# Patient Record
Sex: Female | Born: 2000 | Race: White | Hispanic: No | Marital: Single | Smoking: Never smoker
Health system: Southern US, Community
[De-identification: ages and names within clinical notes are randomized; demographics above are authoritative.]

## PROBLEM LIST (undated history)

## (undated) HISTORY — DX: Morbid (severe) obesity due to excess calories: E66.01

---
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
## 2019-02-05 ENCOUNTER — Other Ambulatory Visit: Payer: Self-pay | Admitting: Family Medicine

## 2019-02-05 NOTE — Telephone Encounter (Signed)
Please contact patient. Recommend physical this fall. Last one April 2018. Thanks.

## 2019-10-22 ENCOUNTER — Ambulatory Visit: Payer: Managed Care, Other (non HMO) | Attending: Internal Medicine

## 2019-10-22 ENCOUNTER — Ambulatory Visit (INDEPENDENT_AMBULATORY_CARE_PROVIDER_SITE_OTHER): Payer: Managed Care, Other (non HMO) | Admitting: Family Medicine

## 2019-10-22 ENCOUNTER — Other Ambulatory Visit: Payer: Self-pay

## 2019-10-22 DIAGNOSIS — Z20822 Contact with and (suspected) exposure to covid-19: Secondary | ICD-10-CM

## 2019-10-22 DIAGNOSIS — J310 Chronic rhinitis: Secondary | ICD-10-CM

## 2019-10-22 DIAGNOSIS — J329 Chronic sinusitis, unspecified: Secondary | ICD-10-CM | POA: Diagnosis not present

## 2019-10-22 MED ORDER — ONDANSETRON 4 MG PO TBDP: ORAL_TABLET | ORAL | 0 refills | Status: DC

## 2019-10-22 MED ORDER — AMOXICILLIN 500 MG PO CAPS: 500.0000 mg | ORAL_CAPSULE | Freq: Three times a day (TID) | ORAL | 0 refills | Status: DC

## 2019-10-22 NOTE — Progress Notes (Signed)
   Subjective:  Audio   Patient ID: Krista Guzman, female    DOB: [birth year], 19 y.o.   MRN: [id number]  Cough This is a new problem. Episode onset: 8 days. Associated symptoms comments: Congestion, headache.   Nausea, Dizziness and lightheaded for the past 12 hours. Has been in bed for the last few days.   Virtual Visit via Telephone Note  I connected with Krista Guzman on 10/22/19 at 10:00 AM EDT by telephone and verified that I am speaking with the correct person using two identifiers.  Location: Patient: home Provider: office   I discussed the limitations, risks, security and privacy concerns of performing an evaluation and management service by telephone and the availability of in person appointments. I also discussed with the patient that there may be a patient responsible charge related to this service. The patient expressed understanding and agreed to proceed.   History of Present Illness:    Observations/Objective:   Assessment and Plan:   Follow Up Instructions:    I discussed the assessment and treatment plan with the patient. The patient was provided an opportunity to ask questions and all were answered. The patient agreed with the plan and demonstrated an understanding of the instructions.   The patient was advised to call back or seek an in-person evaluation if the symptoms worsen or if the condition fails to improve as anticipated.  I provided 22 minutes of non-face-to-face time during this encounter.  h a fri night  Sat morn ticle throat and cough  mon sore throat  tue bad cough  No fever  No aches and chills  Not eating the best  Feels cong in nose n in chest   No diarrhea  Not much   Online   Church actitivities       Review of Systems  Respiratory: Positive for cough.        Objective:   Physical Exam   Virtual     Assessment & Plan:  Impression #1 concerning cascaded symptomatology.  May well be simple springtime virus  plus secondary sinusitis/bronchitis, however cannot rule out Covid with symptomatology.  Antibiotics prescribed.  Zofran as needed.  Warning signs discussed.  Oxygen monitoring encouraged if shortness of breath or positive Covid test.  Await results

## 2019-10-23 ENCOUNTER — Other Ambulatory Visit: Payer: Self-pay | Admitting: *Deleted

## 2019-10-23 ENCOUNTER — Telehealth: Payer: Self-pay | Admitting: Family Medicine

## 2019-10-23 ENCOUNTER — Encounter: Payer: Self-pay | Admitting: Family Medicine

## 2019-10-23 LAB — SARS-COV-2, NAA 2 DAY TAT

## 2019-10-23 LAB — NOVEL CORONAVIRUS, NAA: SARS-CoV-2, NAA: DETECTED — AB

## 2019-10-23 MED ORDER — AMOXICILLIN 400 MG/5ML PO SUSR: ORAL | 0 refills | Status: DC

## 2019-10-23 NOTE — Telephone Encounter (Signed)
Thx for update

## 2019-10-23 NOTE — Telephone Encounter (Signed)
Mom calling to make Dr. Brett Canales aware daughter has tested positive for covid.

## 2019-10-23 NOTE — Telephone Encounter (Signed)
Nurse plz call and tell mom thx for info, how is she doing any new concerns? Of course take all precautions to avoid sppread at home

## 2019-10-24 ENCOUNTER — Encounter: Payer: Self-pay | Admitting: Physician Assistant

## 2019-10-24 ENCOUNTER — Telehealth: Payer: Self-pay | Admitting: Physician Assistant

## 2019-10-24 NOTE — Telephone Encounter (Signed)
Called to discuss with patient about Covid symptoms and the use of bamlanivimab or casirivimab/imdevimab, a monoclonal antibody infusion for those with mild to moderate Covid symptoms and at a high risk of hospitalization.  Pt is qualified for this infusion at the [facility] infusion center due to [employer] to mother who is also sick and pending a covid test. They will plan to come in together for infusion on Monday. Will wait until mother's test results before placing orders.   [name] PA-C  [facility]

## 2019-10-25 ENCOUNTER — Other Ambulatory Visit: Payer: Self-pay | Admitting: Physician Assistant

## 2019-10-25 DIAGNOSIS — U071 COVID-19: Secondary | ICD-10-CM

## 2019-10-25 MED ORDER — SODIUM CHLORIDE 0.9 % IV SOLN
700.0000 mg | Freq: Once | INTRAVENOUS | Status: AC
  Administered 2019-10-26: 12:00:00 700 mg via INTRAVENOUS
  Filled 2019-10-25: qty 700

## 2019-10-25 NOTE — Progress Notes (Signed)
  I connected by phone with Krista Guzman on 10/25/2019 at 7:17 AM to discuss the potential use of an new treatment for mild to moderate COVID-19 viral infection in non-hospitalized patients.  This patient is a 19 y.o. female that meets the FDA criteria for Emergency Use Authorization of bamlanivimab or casirivimab\imdevimab.  Has a (+) direct SARS-CoV-2 viral test result  Has mild or moderate COVID-19   Is ? 20 years of age and weighs ? 40 kg  Is NOT hospitalized due to COVID-19  Is NOT requiring oxygen therapy or requiring an increase in baseline oxygen flow rate due to COVID-19  Is within 10 days of symptom onset  Has at least one of the high risk factor(s) for progression to severe COVID-19 and/or hospitalization as defined in EUA.  Specific high risk criteria : BMI >/= 35   I have spoken and communicated the following to the patient or parent/caregiver:  1. FDA has authorized the emergency use of bamlanivimab and casirivimab\imdevimab for the treatment of mild to moderate COVID-19 in adults and pediatric patients with positive results of direct SARS-CoV-2 viral testing who are 59 years of age and older weighing at least 40 kg, and who are at high risk for progressing to severe COVID-19 and/or hospitalization.  2. The significant known and potential risks and benefits of bamlanivimab and casirivimab\imdevimab, and the extent to which such potential risks and benefits are unknown.  3. Information on available alternative treatments and the risks and benefits of those alternatives, including clinical trials.  4. Patients treated with bamlanivimab and casirivimab\imdevimab should continue to self-isolate and use infection control measures (e.g., wear mask, isolate, social distance, avoid sharing personal items, clean and disinfect "high touch" surfaces, and frequent handwashing) according to CDC guidelines.   5. The patient or parent/caregiver has the option to accept or refuse  bamlanivimab or casirivimab\imdevimab .  After reviewing this information with the patient, The patient agreed to proceed with receiving the bamlanimivab infusion and will be provided a copy of the Fact sheet prior to receiving the infusion..  Sx onset 3/27. Infusion set up for 4/5 @ 11;30. Directions given  [name] 10/25/2019 7:17 AM

## 2019-10-26 ENCOUNTER — Ambulatory Visit (HOSPITAL_COMMUNITY)
Admission: RE | Admit: 2019-10-26 | Discharge: 2019-10-26 | Disposition: A | Discharge status: Home / Self Care | Payer: Managed Care, Other (non HMO) | Source: Ambulatory Visit | Attending: Pulmonary Disease | Admitting: Pulmonary Disease

## 2019-10-26 DIAGNOSIS — U071 COVID-19: Secondary | ICD-10-CM | POA: Diagnosis present

## 2019-10-26 MED ORDER — SODIUM CHLORIDE 0.9 % IV BOLUS
1000.0000 mL | Freq: Once | INTRAVENOUS | Status: AC
  Administered 2019-10-26: 12:00:00 1000 mL via INTRAVENOUS

## 2019-10-26 MED ORDER — FAMOTIDINE IN NACL 20-0.9 MG/50ML-% IV SOLN: 20.0000 mg | Freq: Once | INTRAVENOUS | Status: DC | PRN

## 2019-10-26 MED ORDER — METHYLPREDNISOLONE SODIUM SUCC 125 MG IJ SOLR: 125.0000 mg | Freq: Once | INTRAMUSCULAR | Status: DC | PRN

## 2019-10-26 MED ORDER — ALBUTEROL SULFATE HFA 108 (90 BASE) MCG/ACT IN AERS: 2.0000 | INHALATION_SPRAY | Freq: Once | RESPIRATORY_TRACT | Status: DC | PRN

## 2019-10-26 MED ORDER — DIPHENHYDRAMINE HCL 50 MG/ML IJ SOLN: 50.0000 mg | Freq: Once | INTRAMUSCULAR | Status: DC | PRN

## 2019-10-26 MED ORDER — SODIUM CHLORIDE 0.9 % IV SOLN
INTRAVENOUS | Status: DC | PRN
  Administered 2019-10-26: 12:00:00 250 mL via INTRAVENOUS

## 2019-10-26 MED ORDER — EPINEPHRINE 0.3 MG/0.3ML IJ SOAJ: 0.3000 mg | Freq: Once | INTRAMUSCULAR | Status: DC | PRN

## 2019-10-26 NOTE — Discharge Instructions (Signed)

## 2020-06-28 ENCOUNTER — Ambulatory Visit (INDEPENDENT_AMBULATORY_CARE_PROVIDER_SITE_OTHER): Payer: Managed Care, Other (non HMO) | Admitting: Adult Health

## 2020-06-28 ENCOUNTER — Other Ambulatory Visit: Payer: Self-pay

## 2020-06-28 ENCOUNTER — Encounter: Payer: Self-pay | Admitting: Adult Health

## 2020-06-28 VITALS — BP 124/80 | HR 76 | Ht 69.0 in | Wt 321.0 lb

## 2020-06-28 DIAGNOSIS — N911 Secondary amenorrhea: Secondary | ICD-10-CM | POA: Insufficient documentation

## 2020-06-28 LAB — POCT URINE PREGNANCY: Preg Test, Ur: NEGATIVE

## 2020-06-28 MED ORDER — LO LOESTRIN FE 1 MG-10 MCG / 10 MCG PO TABS: 1.0000 | ORAL_TABLET | Freq: Every day | ORAL | 11 refills | Status: DC

## 2020-06-28 MED ORDER — MEDROXYPROGESTERONE ACETATE 10 MG PO TABS: ORAL_TABLET | ORAL | 0 refills | Status: DC

## 2020-06-28 NOTE — Progress Notes (Signed)
  Subjective:     Patient ID: Krista Guzman, female   DOB: Jun 09, 2001, 19 y.o.   MRN: [id number]  HPI Marin is a 19 year old white female,single,G0P0, in complaining of no period since August and periods always irregular. She is a new pt, her mom is with her. She started at about age 40 and never regular, she tried OCs and they did not help. PCP is [employer].  Review of Systems irregular periods Has never had sex Denies any increased hair Weight is stable Reviewed past medical,surgical, social and family history. Reviewed medications and allergies.      Objective:   Physical Exam BP 124/80 (BP Location: Left Arm, Patient Position: Sitting, Cuff Size: Large)   Pulse 76   Ht 5\' 9"  (1.753 m)   Wt (!) 321 lb (145.6 kg)   LMP 03/14/2020 (Approximate)   BMI 47.40 kg/m   UPT is negative. Skin warm and dry. Neck: mid line trachea, normal thyroid, good ROM, no lymphadenopathy noted. Lungs: clear to ausculation bilaterally. Cardiovascular: regular rate and rhythm.   AA is 0  Fall risk is low PHQ 9 score is 0  Upstream - 06/28/20 1148      Pregnancy Intention Screening   Does the patient want to become pregnant in the next year? N/A    Does the patient's partner want to become pregnant in the next year? N/A    Would the patient like to discuss contraceptive options today? N/A      Contraception Wrap Up   Current Method Abstinence    End Method Oral Contraceptive    Contraception Counseling Provided No          Assessment:     1. Secondary amenorrhea Will try to get withdrawal bleed with provera then cycle with OCs Meds ordered this encounter  Medications  . medroxyPROGESTERone (PROVERA) 10 MG tablet    Sig: Take 1 daily for 10 days    Dispense:  10 tablet    Refill:  0    Order Specific Question:   Supervising Provider    Answer:   14/07/21, LUTHER H [[id number]]  . Norethindrone-Ethinyl Estradiol-Fe Biphas (LO LOESTRIN FE) 1 MG-10 MCG / 10 MCG tablet    Sig: Take 1 tablet by  mouth daily. Take 1 daily by mouth    Dispense:  28 tablet    Refill:  11    BIN Despina Hidden, PCN CN, GRP [id number]    Order Specific Question:   Supervising Provider    Answer:   [id number] [[id number]]  Check CBC,CMP,TSH and A1c  2. Morbid obesity (HCC)     Plan:     Follow up in 3 weeks  May get Duane Lope to assess ovaries and rule out PCO

## 2020-06-29 LAB — TSH: TSH: 1.32 u[IU]/mL (ref 0.450–4.500)

## 2020-06-29 LAB — COMPREHENSIVE METABOLIC PANEL
ALT: 26 IU/L (ref 0–32)
AST: 21 IU/L (ref 0–40)
Albumin/Globulin Ratio: 1.5 (ref 1.2–2.2)
Albumin: 4.5 g/dL (ref 3.9–5.0)
Alkaline Phosphatase: 96 IU/L (ref 42–106)
BUN/Creatinine Ratio: 13 (ref 9–23)
BUN: 9 mg/dL (ref 6–20)
Bilirubin Total: 0.3 mg/dL (ref 0.0–1.2)
CO2: 23 mmol/L (ref 20–29)
Calcium: 9.7 mg/dL (ref 8.7–10.2)
Chloride: 102 mmol/L (ref 96–106)
Creatinine, Ser: 0.72 mg/dL (ref 0.57–1.00)
GFR calc Af Amer: 139 mL/min/{1.73_m2} (ref >59)
GFR calc non Af Amer: 121 mL/min/{1.73_m2} (ref >59)
Globulin, Total: 3.1 g/dL (ref 1.5–4.5)
Glucose: 96 mg/dL (ref 65–99)
Potassium: 4.5 mmol/L (ref 3.5–5.2)
Sodium: 138 mmol/L (ref 134–144)
Total Protein: 7.6 g/dL (ref 6.0–8.5)

## 2020-06-29 LAB — CBC
Hematocrit: 40.1 % (ref 34.0–46.6)
Hemoglobin: 12.9 g/dL (ref 11.1–15.9)
MCH: 25.1 pg — ABNORMAL LOW (ref 26.6–33.0)
MCHC: 32.2 g/dL (ref 31.5–35.7)
MCV: 78 fL — ABNORMAL LOW (ref 79–97)
Platelets: 363 10*3/uL (ref 150–450)
RBC: 5.14 x10E6/uL (ref 3.77–5.28)
RDW: 14.4 % (ref 11.7–15.4)
WBC: 9.5 10*3/uL (ref 3.4–10.8)

## 2020-06-29 LAB — HEMOGLOBIN A1C
Est. average glucose Bld gHb Est-mCnc: 114 mg/dL
Hgb A1c MFr Bld: 5.6 % (ref 4.8–5.6)

## 2020-07-13 ENCOUNTER — Telehealth: Payer: Self-pay | Admitting: Adult Health

## 2020-07-13 NOTE — Telephone Encounter (Signed)
Pt called wondering when to start the birth control pill. Pt started her period yesterday. JAG advised to start birth control pill on Sunday. Pt aware and voiced understanding. JSY

## 2020-07-13 NOTE — Telephone Encounter (Signed)
Pt states that she's started her cycle & is not sure if or when she's supposed to start the oral BC the Weir gave her samples of at her last visit  Please advise & notify pt

## 2020-07-19 ENCOUNTER — Ambulatory Visit (INDEPENDENT_AMBULATORY_CARE_PROVIDER_SITE_OTHER): Payer: Managed Care, Other (non HMO) | Admitting: Adult Health

## 2020-07-19 ENCOUNTER — Encounter: Payer: Self-pay | Admitting: Adult Health

## 2020-07-19 ENCOUNTER — Other Ambulatory Visit: Payer: Self-pay

## 2020-07-19 VITALS — BP 130/78 | HR 90 | Ht 69.0 in | Wt 321.0 lb

## 2020-07-19 DIAGNOSIS — Z7689 Persons encountering health services in other specified circumstances: Secondary | ICD-10-CM | POA: Insufficient documentation

## 2020-07-19 NOTE — Progress Notes (Signed)
  Subjective:     Patient ID: Krista Guzman, female   DOB: [birth year], 19 y.o.   MRN: [id number]  HPI Krista Guzman is a 19 year old white female,single, G0P0, back in follow up on taking provera to get withdrawal bleed and she had a period and has started lo loestrin. PCP is Lilyan Punt MD.   Review of Systems Periods started with provera and she has started lo Loestrin Reviewed past medical,surgical, social and family history. Reviewed medications and allergies.     Objective:   Physical Exam BP 130/78 (BP Location: Left Arm, Patient Position: Sitting, Cuff Size: Normal)   Pulse 90   Ht 5\' 9"  (1.753 m)   Wt (!) 321 lb (145.6 kg)   LMP 07/12/2020   BMI 47.40 kg/m  Skin warm and dry.  Lungs: clear to ausculation bilaterally. Cardiovascular: regular rate and rhythm.  Upstream - 07/19/20 1419      Pregnancy Intention Screening   Does the patient want to become pregnant in the next year? No    Does the patient's partner want to become pregnant in the next year? No    Would the patient like to discuss contraceptive options today? No      Contraception Wrap Up   Current Method Abstinence   On OCs too   End Method Oral Contraceptive   abstinence   Contraception Counseling Provided No             Assessment:     1. Encounter for menstrual regulation Continue lo Loestrin,has refills    Plan:     Follow up in 3 months

## 2020-08-29 ENCOUNTER — Encounter: Payer: Self-pay | Admitting: Family Medicine

## 2020-08-29 ENCOUNTER — Telehealth: Payer: Self-pay | Admitting: Family Medicine

## 2020-08-29 ENCOUNTER — Telehealth (INDEPENDENT_AMBULATORY_CARE_PROVIDER_SITE_OTHER): Payer: Managed Care, Other (non HMO) | Admitting: Family Medicine

## 2020-08-29 ENCOUNTER — Other Ambulatory Visit: Payer: Self-pay

## 2020-08-29 DIAGNOSIS — K219 Gastro-esophageal reflux disease without esophagitis: Secondary | ICD-10-CM | POA: Diagnosis not present

## 2020-08-29 MED ORDER — PANTOPRAZOLE SODIUM 40 MG PO TBEC: 40.0000 mg | DELAYED_RELEASE_TABLET | Freq: Every day | ORAL | 1 refills | Status: DC

## 2020-08-29 NOTE — Patient Instructions (Signed)
Food Choices for Gastroesophageal Reflux Disease, Adult When you have gastroesophageal reflux disease (GERD), the foods you eat and your eating habits are very important. Choosing the right foods can help ease your discomfort. Think about working with a food expert (dietitian) to help you make good choices. What are tips for following this plan? Reading food labels  Look for foods that are low in saturated fat. Foods that may help with your symptoms include: ? Foods that have less than 5% of daily value (DV) of fat. ? Foods that have 0 grams of trans fat. Cooking  Do not fry your food.  Cook your food by baking, steaming, grilling, or broiling. These are all methods that do not need a lot of fat for cooking.  To add flavor, try to use herbs that are low in spice and acidity. Meal planning  Choose healthy foods that are low in fat, such as: ? Fruits and vegetables. ? Whole grains. ? Low-fat dairy products. ? Lean meats, fish, and poultry.  Eat small meals often instead of eating 3 large meals each day. Eat your meals slowly in a place where you are relaxed. Avoid bending over or lying down until 2-3 hours after eating.  Limit high-fat foods such as fatty meats or fried foods.  Limit your intake of fatty foods, such as oils, butter, and shortening.  Avoid the following as told by your doctor: ? Foods that cause symptoms. These may be different for different people. Keep a food diary to keep track of foods that cause symptoms. ? Alcohol. ? Drinking a lot of liquid with meals. ? Eating meals during the 2-3 hours before bed.   Lifestyle  Stay at a healthy weight. Ask your doctor what weight is healthy for you. If you need to lose weight, work with your doctor to do so safely.  Exercise for at least 30 minutes on 5 or more days each week, or as told by your doctor.  Wear loose-fitting clothes.  Do not smoke or use any products that contain nicotine or tobacco. If you need help  quitting, ask your doctor.  Sleep with the head of your bed higher than your feet. Use a wedge under the mattress or blocks under the bed frame to raise the head of the bed.  Chew sugar-free gum after meals. What foods should eat? Eat a healthy, well-balanced diet of fruits, vegetables, whole grains, low-fat dairy products, lean meats, fish, and poultry. Each person is different. Foods that may cause symptoms in one person may not cause any symptoms in another person. Work with your doctor to find foods that are safe for you. The items listed above may not be a complete list of what you can eat and drink. Contact a food expert for more options.   What foods should I avoid? Limiting some of these foods may help in managing the symptoms of GERD. Everyone is different. Talk with a food expert or your doctor to help you find the exact foods to avoid, if any. Fruits Any fruits prepared with added fat. Any fruits that cause symptoms. For some people, this may include citrus fruits, such as oranges, grapefruit, pineapple, and lemons. Vegetables Deep-fried vegetables. French fries. Any vegetables prepared with added fat. Any vegetables that cause symptoms. For some people, this may include tomatoes and tomato products, chili peppers, onions and garlic, and horseradish. Grains Pastries or quick breads with added fat. Meats and other proteins High-fat meats, such as fatty beef or pork,   hot dogs, ribs, ham, sausage, salami, and bacon. Fried meat or protein, including fried fish and fried chicken. Nuts and nut butters, in large amounts. Dairy Whole milk and chocolate milk. Sour cream. Cream. Ice cream. Cream cheese. Milkshakes. Fats and oils Butter. Margarine. Shortening. Ghee. Beverages Coffee and tea, with or without caffeine. Carbonated beverages. Sodas. Energy drinks. Fruit juice made with acidic fruits, such as orange or grapefruit. Tomato juice. Alcoholic drinks. Sweets and desserts Chocolate and  cocoa. Donuts. Seasonings and condiments Pepper. Peppermint and spearmint. Added salt. Any condiments, herbs, or seasonings that cause symptoms. For some people, this may include curry, hot sauce, or vinegar-based salad dressings. The items listed above may not be a complete list of what you should not eat and drink. Contact a food expert for more options. Questions to ask your doctor Diet and lifestyle changes are often the first steps that are taken to manage symptoms of GERD. If diet and lifestyle changes do not help, talk with your doctor about taking medicines. Where to find more information  International [employer] for Gastrointestinal Disorders: aboutgerd.org Summary  When you have GERD, food and lifestyle choices are very important in easing your symptoms.  Eat small meals often instead of 3 large meals a day. Eat your meals slowly and in a place where you are relaxed.  Avoid bending over or lying down until 2-3 hours after eating.  Limit high-fat foods such as fatty meats or fried foods. This information is not intended to replace advice given to you by your health care provider. Make sure you discuss any questions you have with your health care provider. Document Revised: 01/18/2020 Document Reviewed: 01/18/2020 Elsevier Patient Education  2021 Elsevier Inc.  

## 2020-08-29 NOTE — Progress Notes (Addendum)
Pt having ongoing issues with heartburn and indigestion. Pt has been struggling with heartburn for about 2 years. Was taking OTC meds but they stopped working. Pt is now trying to avoid certain foods.     Patient ID: Krista Guzman, female    DOB: [birth year], 20 y.o.   MRN: [id number]   Chief Complaint  Patient presents with   Gastroesophageal Reflux   Subjective:  CC: acid reflux and belly button "infection"   This is a chronic problem.  Presents today via telephone visit with a complaint of heartburn and indigestion.  Symptoms have been present for greater than 2 years.  Reports that the chest hurts at the bottom of her esophagus, she denies burning, coughing, reports that it is worse with lying down.  Has tried over-the-counter famotidine 10 mg daily that is no longer working, had some leftover pantoprazole, and took a dose last night.  Has tried to avoid tomato products, hot and spicy foods, and salt.  Denies alcohol use, no coffee use.  Does endorse drinking sparkling water, and some carbonated beverages.  Also reports that she is having some skin irritation in her bellybutton area, believes that this might be infected.  Has tried keeping it clean and putting Desitin ointment.  Denies fever, reports having chills occasionally with reflux, no shortness of breath.  All symptoms appear to be related to her reflux symptoms.    Medical History Kasiah has a past medical history of Morbid obesity (HCC).   Outpatient Encounter Medications as of 08/29/2020  Medication Sig   Norethindrone-Ethinyl Estradiol-Fe Biphas (LO LOESTRIN FE) 1 MG-10 MCG / 10 MCG tablet Take 1 tablet by mouth daily. Take 1 daily by mouth   pantoprazole (PROTONIX) 40 MG tablet Take 1 tablet (40 mg total) by mouth daily.   No facility-administered encounter medications on file as of 08/29/2020.     Review of Systems  Constitutional: Positive for chills. Negative for fever.       Chills with reflux  HENT: Negative for  congestion, ear pain and sore throat.   Respiratory: Negative for cough, chest tightness and shortness of breath.   Cardiovascular: Positive for chest pain.       Bottom of esophagus. Relieved by Tums and aspirin. Standing up and moving around. If laying- left side makes it better.    Gastrointestinal: Positive for nausea. Negative for abdominal pain, diarrhea and vomiting.       With reflux  Neurological: Positive for light-headedness. Negative for dizziness and headaches.       If standing too long with reflux.      Vitals There were no vitals taken for this visit.  Objective:   Physical Exam Unable, able to converse throughout telephone visit without any shortness of breath.  Assessment and Plan   1. Gastroesophageal reflux disease, unspecified whether esophagitis present - pantoprazole (PROTONIX) 40 MG tablet; Take 1 tablet (40 mg total) by mouth daily.  Dispense: 30 tablet; Refill: 1    GERD: Will initiate 8-week trial of PPI, information given on foods to avoid, foods to include for GERD.  Explained how prevention is best treatment, long-term treatment with PPI risk for osteoporosis and vitamin malabsorption.  Possible umbilicus infection: She will continue to clean umbilicus with antibacterial soap, keep it clean and dry.  If symptoms persist, she will need an in person visit for physical exam.  Agrees with plan of care discussed today. Understands warning signs to seek further care: chest pain, shortness of breath, any  significant change in health.  Understands to follow-up in 2 months for acid reflux, and to assess umbilical area.  Information given on foods for GERD.  If symptoms persist after 8-week trial of PPI, will consider GI specialist referral.    Novella Olive, NP 08/29/2020   Virtual Visit via Telephone Note  I connected with Krista Guzman on 08/30/20 at 11:00 AM EST by telephone and verified that I am speaking with the correct person using two  identifiers.  Location: Patient: home Provider: office   I discussed the limitations, risks, security and privacy concerns of performing an evaluation and management service by telephone and the availability of in person appointments. I also discussed with the patient that there may be a patient responsible charge related to this service. The patient expressed understanding and agreed to proceed.   History of Present Illness:    Observations/Objective:   Assessment and Plan:   Follow Up Instructions:    I discussed the assessment and treatment plan with the patient. The patient was provided an opportunity to ask questions and all were answered. The patient agreed with the plan and demonstrated an understanding of the instructions.   The patient was advised to call back or seek an in-person evaluation if the symptoms worsen or if the condition fails to improve as anticipated.  I provided 16 minutes of non-face-to-face time during this encounter.   Novella Olive, NP    Location: Patient: home Provider: office   I discussed the limitations of evaluation and management by telemedicine and the availability of in person appointments. The patient expressed understanding and agreed to proceed.  History of Present Illness:    Observations/Objective:   Assessment and Plan:   Follow Up Instructions:    I discussed the assessment and treatment plan with the patient. The patient was provided an opportunity to ask questions and all were answered. The patient agreed with the plan and demonstrated an understanding of the instructions.   The patient was advised to call back or seek an in-person evaluation if the symptoms worsen or if the condition fails to improve as anticipated.  I provided 16 minutes of non-face-to-face time during this encounter.

## 2020-08-29 NOTE — Telephone Encounter (Signed)
Ms. Krista Guzman, Krista Guzman are scheduled for a virtual visit with your provider today.    Just as we do with appointments in the office, we must obtain your consent to participate.  Your consent will be active for this visit and any virtual visit you may have with one of our providers in the next 365 days.    If you have a MyChart account, I can also send a copy of this consent to you electronically.  All virtual visits are billed to your insurance company just like a traditional visit in the office.  As this is a virtual visit, video technology does not allow for your provider to perform a traditional examination.  This may limit your provider's ability to fully assess your condition.  If your provider identifies any concerns that need to be evaluated in person or the need to arrange testing such as labs, EKG, etc, we will make arrangements to do so.    Although advances in technology are sophisticated, we cannot ensure that it will always work on either your end or our end.  If the connection with a video visit is poor, we may have to switch to a telephone visit.  With either a video or telephone visit, we are not always able to ensure that we have a secure connection.   I need to obtain your verbal consent now.   Are you willing to proceed with your visit today?   Krista Guzman has provided verbal consent on 08/29/2020 for a virtual visit (video or telephone).   Marlowe Shores, LPN 01/27/4127  7:86 AM

## 2020-09-30 ENCOUNTER — Encounter: Payer: Self-pay | Admitting: Family Medicine

## 2020-10-04 ENCOUNTER — Encounter: Payer: Managed Care, Other (non HMO) | Admitting: Family Medicine

## 2020-10-17 ENCOUNTER — Encounter: Payer: Self-pay | Admitting: Adult Health

## 2020-10-17 ENCOUNTER — Other Ambulatory Visit: Payer: Self-pay

## 2020-10-17 ENCOUNTER — Ambulatory Visit (INDEPENDENT_AMBULATORY_CARE_PROVIDER_SITE_OTHER): Payer: Managed Care, Other (non HMO) | Admitting: Adult Health

## 2020-10-17 VITALS — BP 116/80 | HR 85 | Ht 69.0 in | Wt 329.5 lb

## 2020-10-17 DIAGNOSIS — Z7689 Persons encountering health services in other specified circumstances: Secondary | ICD-10-CM | POA: Diagnosis not present

## 2020-10-17 MED ORDER — NORETHIN ACE-ETH ESTRAD-FE 1-20 MG-MCG PO TABS: 1.0000 | ORAL_TABLET | Freq: Every day | ORAL | 11 refills | Status: DC

## 2020-10-17 NOTE — Progress Notes (Signed)
  Subjective:     Patient ID: Krista Guzman, female   DOB: [birth year], 20 y.o.   MRN: [id number]  HPI Krista Guzman is a 20 year old white female,single, G0P0 back in follow up after starting Lo Loestrin and has not had a period, but wants one. She had missed periods and took provera to start a period, prior to Lo Loestrin. PCP is Dr Lilyan Punt.  Review of Systems No period on Lo Loestrin Has not had sex Reviewed past medical,surgical, social and family history. Reviewed medications and allergies.     Objective:   Physical Exam BP 116/80 (BP Location: Left Arm, Patient Position: Sitting, Cuff Size: Large)   Pulse 85   Ht 5\' 9"  (1.753 m)   Wt (!) 329 lb 8 oz (149.5 kg)   BMI 48.66 kg/m   Talk only:  She wants a period will change OCs.  Fall risk is low  Upstream - 10/17/20 1440      Pregnancy Intention Screening   Does the patient want to become pregnant in the next year? No    Does the patient's partner want to become pregnant in the next year? No    Would the patient like to discuss contraceptive options today? Yes      Contraception Wrap Up   Current Method Oral Contraceptive   abs   End Method Oral Contraceptive    Contraception Counseling Provided Yes          Assessment:     1. Encounter for menstrual regulation Finish current pack of Lo Loestrin and start junel Meds ordered this encounter  Medications  . norethindrone-ethinyl estradiol (LOESTRIN FE) 1-20 MG-MCG tablet    Sig: Take 1 tablet by mouth daily.    Dispense:  28 tablet    Refill:  11    Order Specific Question:   Supervising Provider    Answer:   10/19/20 [[id number]]      Plan:     Follow up in 3 months No GC/CHL has never had sex

## 2020-10-24 ENCOUNTER — Ambulatory Visit: Payer: Managed Care, Other (non HMO) | Admitting: Family Medicine

## 2020-10-24 ENCOUNTER — Other Ambulatory Visit: Payer: Self-pay

## 2020-10-24 ENCOUNTER — Encounter: Payer: Self-pay | Admitting: Family Medicine

## 2020-10-24 VITALS — BP 136/82 | HR 91 | Temp 98.4°F | Ht 69.0 in | Wt 327.0 lb

## 2020-10-24 DIAGNOSIS — M25531 Pain in right wrist: Secondary | ICD-10-CM

## 2020-10-24 MED ORDER — NAPROXEN 500 MG PO TABS: 500.0000 mg | ORAL_TABLET | Freq: Two times a day (BID) | ORAL | 0 refills | Status: DC

## 2020-10-24 NOTE — Progress Notes (Signed)
   Patient ID: Krista Guzman, female    DOB: April 06, 2001, 20 y.o.   MRN: [id number]   No chief complaint on file.  Subjective:  CC; right wrist pain  This is a new problem.  Presents today for right wrist pain.  Describes the pain as "quick "does not last long, worse with computer work.  Symptoms started on March 3.  Denies any known injury, swelling, erythema.  No numbness no tingling no radiating pain.  Has tried compression wrap and it feels it has gotten better.  Denies history of GI bleed, cardiac issues or hypertension.   right wrist pain since march 3rd. Pain when using it a certain way or uses it too much. Has not tried any treatments.    Medical History Rickiya has a past medical history of Morbid obesity (HCC).   Outpatient Encounter Medications as of 10/24/2020  Medication Sig  . naproxen (NAPROSYN) 500 MG tablet Take 1 tablet (500 mg total) by mouth 2 (two) times daily with a meal.  . norethindrone-ethinyl estradiol (LOESTRIN FE) 1-20 MG-MCG tablet Take 1 tablet by mouth daily.  . pantoprazole (PROTONIX) 40 MG tablet Take 1 tablet (40 mg total) by mouth daily.   No facility-administered encounter medications on file as of 10/24/2020.     Review of Systems  Constitutional: Negative for chills and fever.  Respiratory: Negative for shortness of breath.   Cardiovascular: Negative for chest pain.  Musculoskeletal: Negative for joint swelling.       Right wrist pain, no swelling, no numbness/tingling, no weakness.   Neurological: Negative for headaches.     Vitals BP 136/82   Pulse 91   Temp 98.4 F (36.9 C)   Ht 5\' 9"  (1.753 m)   Wt (!) 327 lb (148.3 kg)   SpO2 98%   BMI 48.29 kg/m   Objective:   Physical Exam Vitals reviewed.  Constitutional:      Appearance: Normal appearance.  Cardiovascular:     Rate and Rhythm: Normal rate and regular rhythm.     Heart sounds: Normal heart sounds.  Pulmonary:     Effort: Pulmonary effort is normal.     Breath sounds:  Normal breath sounds.  Musculoskeletal:     Right wrist: No swelling or deformity. Normal range of motion. Normal pulse.     Comments: Negative Tinel and Phalen sign. Reports occasional tenderness to touch. Not today.   Skin:    General: Skin is warm and dry.  Neurological:     General: No focal deficit present.     Mental Status: She is alert.  Psychiatric:        Behavior: Behavior normal.      Assessment and Plan   1. Acute pain of right wrist - naproxen (NAPROSYN) 500 MG tablet; Take 1 tablet (500 mg total) by mouth 2 (two) times daily with a meal.  Dispense: 28 tablet; Refill: 0   Will take naproxen twice per day with food for 2 weeks.  Continue using compression for comfort, instructed to use wrist brace for computer work.  Symptoms have improved significantly prior to visit today.  Agrees with plan of care discussed today. Understands warning signs to seek further care: chest pain, shortness of breath, any significant change in health.  Understands to follow-up if symptoms worsen, do not improve.    , NP 10/24/2020

## 2020-10-24 NOTE — Patient Instructions (Signed)
Use wrist brace when on the computer. Compression for comfort. Naproxen twice per day with food for 2 weeks.  Ice for 20 minutes at time every 1-2 hours.       Carpal Tunnel Syndrome  Carpal tunnel syndrome is a condition that causes pain, weakness, and numbness in your hand and arm. Numbness is when you cannot feel an area in your body. The carpal tunnel is a narrow area that is on the palm side of your wrist. Repeated wrist motion or certain diseases may cause swelling in the tunnel. This swelling can pinch the main nerve in the wrist. This nerve is called the median nerve. What are the causes? This condition may be caused by:  Moving your hand and wrist over and over again while doing a task.  Injury to the wrist.  Arthritis.  A sac of fluid (cyst) or abnormal growth (tumor) in the carpal tunnel.  Fluid buildup during pregnancy.  Use of tools that vibrate. Sometimes the cause is not known. What increases the risk? The following factors may make you more likely to have this condition:  Having a job that makes you do these things: ? Move your hand over and over again. ? Work with tools that vibrate, such as drills or sanders.  Being a woman.  Having diabetes, obesity, thyroid problems, or kidney failure. What are the signs or symptoms? Symptoms of this condition include:  A tingling feeling in your fingers.  Tingling or loss of feeling in your hand.  Pain in your entire arm. This pain may get worse when you bend your wrist and elbow for a long time.  Pain in your wrist that goes up your arm to your shoulder.  Pain that goes down into your palm or fingers.  Weakness in your hands. You may find it hard to grab and hold items. You may feel worse at night. How is this treated? This condition may be treated with:  Lifestyle changes. You will be asked to stop or change the activity that caused your problem.  Doing exercises and activities that make bones, muscles,  and tendons stronger (physical therapy).  Learning how to use your hand again (occupational therapy).  Medicines for pain and swelling. You may have injections in your wrist.  A wrist splint or brace.  Surgery. Follow these instructions at home: If you have a splint or brace:  Wear the splint or brace as told by your doctor. Take it off only as told by your doctor.  Loosen the splint if your fingers: ? Tingle. ? Become numb. ? Turn cold and blue.  Keep the splint or brace clean.  If the splint or brace is not waterproof: ? Do not let it get wet. ? Cover it with a watertight covering when you take a bath or a shower. Managing pain, stiffness, and swelling If told, put ice on the painful area:  If you have a removable splint or brace, remove it as told by your doctor.  Put ice in a plastic bag.  Place a towel between your skin and the bag.  Leave the ice on for 20 minutes, 2-3 times per day. Do not fall asleep with the cold pack on your skin.  Take off the ice if your skin turns bright red. This is very important. If you cannot feel pain, heat, or cold, you have a greater risk of damage to the area. Move your fingers often to reduce stiffness and swelling.   General instructions  Take over-the-counter and prescription medicines only as told by your doctor.  Rest your wrist from any activity that may cause pain. If needed, talk with your boss at work about changes that can help your wrist heal.  Do exercises as told by your doctor, physical therapist, or occupational therapist.  Keep all follow-up visits. Contact a doctor if:  You have new symptoms.  Medicine does not help your pain.  Your symptoms get worse. Get help right away if:  You have very bad numbness or tingling in your wrist or hand. Summary  Carpal tunnel syndrome is a condition that causes pain in your hand and arm.  It is often caused by repeated wrist motions.  Lifestyle changes and medicines  are used to treat this problem. Surgery may help in very bad cases.  Follow your doctor's instructions about wearing a splint, resting your wrist, keeping follow-up visits, and calling for help. This information is not intended to replace advice given to you by your health care provider. Make sure you discuss any questions you have with your health care provider. Document Revised: 11/19/2019 Document Reviewed: 11/19/2019 Elsevier Patient Education  2021 [employer].

## 2021-01-17 ENCOUNTER — Other Ambulatory Visit: Payer: Self-pay

## 2021-01-17 ENCOUNTER — Encounter: Payer: Self-pay | Admitting: Adult Health

## 2021-01-17 ENCOUNTER — Ambulatory Visit (INDEPENDENT_AMBULATORY_CARE_PROVIDER_SITE_OTHER): Payer: Managed Care, Other (non HMO) | Admitting: Adult Health

## 2021-01-17 VITALS — BP 141/87 | HR 102 | Ht 69.0 in | Wt 329.0 lb

## 2021-01-17 DIAGNOSIS — N911 Secondary amenorrhea: Secondary | ICD-10-CM | POA: Diagnosis not present

## 2021-01-17 DIAGNOSIS — Z3202 Encounter for pregnancy test, result negative: Secondary | ICD-10-CM

## 2021-01-17 LAB — POCT URINE PREGNANCY: Preg Test, Ur: NEGATIVE

## 2021-01-17 MED ORDER — MEDROXYPROGESTERONE ACETATE 10 MG PO TABS: 10.0000 mg | ORAL_TABLET | Freq: Every day | ORAL | 4 refills | Status: DC

## 2021-01-17 NOTE — Progress Notes (Signed)
  Subjective:     Patient ID: Krista Guzman, female   DOB: [birth year], 20 y.o.   MRN: [id number]  HPI Krista Guzman is a 20 year old whit female,single, G0P0, back in follow up on not having a period. PCP is [facility].   Review of Systems Had period in December after provera, did not have on OCs so stopped them, because she says they affected her moods and she is better off of them Has never had sex Reviewed past medical,surgical, social and family history. Reviewed medications and allergies.     Objective:   Physical Exam BP (!) 141/87 (BP Location: Left Arm, Patient Position: Sitting, Cuff Size: Normal)   Pulse (!) 102   Ht 5\' 9"  (1.753 m)   Wt (!) 329 lb (149.2 kg)   LMP 07/12/2020   BMI 48.58 kg/m  UPT is negative Skin warm and dry. . Lungs: clear to ausculation bilaterally. Cardiovascular: regular rate and rhythm.   Upstream - 01/17/21 1406       Pregnancy Intention Screening   Does the patient want to become pregnant in the next year? No    Does the patient's partner want to become pregnant in the next year? No    Would the patient like to discuss contraceptive options today? No      Contraception Wrap Up   Current Method Abstinence    End Method Abstinence    Contraception Counseling Provided No                Assessment:     1. Secondary amenorrhea Will cycle every 3 months with provera Meds ordered this encounter  Medications   medroxyPROGESTERone (PROVERA) 10 MG tablet    Sig: Take 1 tablet (10 mg total) by mouth daily. Take 1 daily for 10 days, every 3 months, days 1-10.    Dispense:  30 tablet    Refill:  4    Order Specific Question:   Supervising Provider    Answer:   01/19/21 [[id number]]       Plan:     Return in 2 months for pap and physical

## 2021-01-26 ENCOUNTER — Telehealth: Payer: Managed Care, Other (non HMO) | Admitting: Family Medicine

## 2021-01-26 ENCOUNTER — Other Ambulatory Visit: Payer: Self-pay

## 2021-01-26 DIAGNOSIS — R1013 Epigastric pain: Secondary | ICD-10-CM

## 2021-01-26 NOTE — Telephone Encounter (Signed)
Pt went to the urgent care Tuesday because of throwing up and in pain. Mother states that urgent care, states that she needs to follow up with primary care provider and blood work within a week . Mother would like a call back

## 2021-01-26 NOTE — Telephone Encounter (Signed)
Pt went to [facility] in [place] 2 days ago. States she had been off of her acid reflux med for awhile and started having pain in abdomen upper mid area, heart burn, nausea and vomiting. States they gave her something to drink that stopped the nausea and vomiting. Having sharp stabbing pains in abdomen. Feels worse when taking a deep breath or sitting up straight. Mom told her it was her gallbladder. Does have pain after eating. Low grade fever she thinks. Has not checked but felt cold all day yesterday. Stayed in the bed yesterday. Feels some better today. Started back on acid med. States urgent care told her she needed bw and a scan. She is not sure if they were going to set that up for her or not. She is using aleve, ice and a heating pad

## 2021-01-26 NOTE — Telephone Encounter (Signed)
Lab orders placed and [place] order placed. Mom contacted and verbalized understanding. Pt placed on schedule for 01/30/21 at 4:10pm

## 2021-01-26 NOTE — Telephone Encounter (Signed)
May order CBC, liver, metabolic 7, amylase, lipase May order abdominal ultrasound with reason being epigastric and right upper quadrant discomfort  May set up office visit next week with myself or with Dr. Ladona Ridgel Please note I will not be doing more than 18 patients per day unless authorized by myself So therefore on one of the days that is 17 there should be 1 availability Dr. Ladona Ridgel also has openings as well

## 2021-01-30 ENCOUNTER — Ambulatory Visit: Payer: Managed Care, Other (non HMO) | Admitting: Family Medicine

## 2021-01-30 ENCOUNTER — Other Ambulatory Visit: Payer: Self-pay

## 2021-01-30 ENCOUNTER — Encounter: Payer: Self-pay | Admitting: Family Medicine

## 2021-01-30 VITALS — BP 131/81 | HR 92 | Temp 98.8°F | Ht 69.0 in | Wt 323.0 lb

## 2021-01-30 DIAGNOSIS — K219 Gastro-esophageal reflux disease without esophagitis: Secondary | ICD-10-CM

## 2021-01-30 NOTE — Patient Instructions (Signed)

## 2021-01-30 NOTE — Progress Notes (Signed)
   Subjective:    Patient ID: Krista Guzman, female    DOB: 2001-06-21, 20 y.o.   MRN: [id number]  HPIfollow up abdominal pain. Went to urgent care in [place] 7 days and prescribed protonix. Pt states she never took the med and Pt is now gone.   Renato Battles N and v Went to urgent care Then v times 2 Ongoing stomach pain Pain near gallbladder Pain went away on Friday Improving now   Review of Systems     Objective:   Physical Exam  General-in no acute distress Eyes-no discharge Lungs-respiratory rate normal, CTA CV-no murmurs,RRR Extremities skin warm dry no edema Neuro grossly normal Behavior normal, alert       Assessment & Plan:   Abdominal symptoms much better now.  No reflux symptoms feeling better.  We did discuss various approaches.  Currently we will stay away from any PPI.  Healthy diet recommended.  Follow-up on a as needed basis sooner if any issues We did educate what diet to follow and educated warning signs to watch No need to do her blood work no need to do ultrasound

## 2021-03-20 ENCOUNTER — Other Ambulatory Visit: Payer: Managed Care, Other (non HMO) | Admitting: Adult Health

## 2021-09-14 ENCOUNTER — Telehealth: Payer: Self-pay | Admitting: Nurse Practitioner

## 2021-09-14 NOTE — Telephone Encounter (Signed)
Patient has physical on 3/15 and needing labs done

## 2021-09-15 ENCOUNTER — Other Ambulatory Visit: Payer: Self-pay | Admitting: Nurse Practitioner

## 2021-09-15 DIAGNOSIS — E282 Polycystic ovarian syndrome: Secondary | ICD-10-CM

## 2021-09-15 DIAGNOSIS — R7303 Prediabetes: Secondary | ICD-10-CM

## 2021-09-15 DIAGNOSIS — N911 Secondary amenorrhea: Secondary | ICD-10-CM

## 2021-09-15 NOTE — Telephone Encounter (Signed)
[facility], Leonna S, NP  Labs ordered.   Leonna  

## 2021-09-15 NOTE — Telephone Encounter (Signed)
Blood work ordered in [employer]. Mother(DPR) notified.

## 2021-09-20 DIAGNOSIS — N911 Secondary amenorrhea: Secondary | ICD-10-CM | POA: Diagnosis not present

## 2021-09-20 DIAGNOSIS — R7303 Prediabetes: Secondary | ICD-10-CM | POA: Diagnosis not present

## 2021-09-20 DIAGNOSIS — E282 Polycystic ovarian syndrome: Secondary | ICD-10-CM | POA: Diagnosis not present

## 2021-09-21 LAB — CBC WITH DIFFERENTIAL/PLATELET
Basophils Absolute: 0.1 10*3/uL (ref 0.0–0.2)
Basos: 1 %
EOS (ABSOLUTE): 0.2 10*3/uL (ref 0.0–0.4)
Eos: 2 %
Hematocrit: 40.8 % (ref 34.0–46.6)
Hemoglobin: 13.2 g/dL (ref 11.1–15.9)
Immature Grans (Abs): 0 10*3/uL (ref 0.0–0.1)
Immature Granulocytes: 0 %
Lymphocytes Absolute: 2.9 10*3/uL (ref 0.7–3.1)
Lymphs: 30 %
MCH: 25.1 pg — ABNORMAL LOW (ref 26.6–33.0)
MCHC: 32.4 g/dL (ref 31.5–35.7)
MCV: 78 fL — ABNORMAL LOW (ref 79–97)
Monocytes Absolute: 0.5 10*3/uL (ref 0.1–0.9)
Monocytes: 5 %
Neutrophils Absolute: 6.1 10*3/uL (ref 1.4–7.0)
Neutrophils: 62 %
Platelets: 314 10*3/uL (ref 150–450)
RBC: 5.25 x10E6/uL (ref 3.77–5.28)
RDW: 14.4 % (ref 11.7–15.4)
WBC: 9.7 10*3/uL (ref 3.4–10.8)

## 2021-09-21 LAB — LIPID PANEL
Chol/HDL Ratio: 4.9 ratio — ABNORMAL HIGH (ref 0.0–4.4)
Cholesterol, Total: 158 mg/dL (ref 100–199)
HDL: 32 mg/dL — ABNORMAL LOW (ref >39)
LDL Chol Calc (NIH): 110 mg/dL — ABNORMAL HIGH (ref 0–99)
Triglycerides: 81 mg/dL (ref 0–149)
VLDL Cholesterol Cal: 16 mg/dL (ref 5–40)

## 2021-09-21 LAB — HEMOGLOBIN A1C
Est. average glucose Bld gHb Est-mCnc: 120 mg/dL
Hgb A1c MFr Bld: 5.8 % — ABNORMAL HIGH (ref 4.8–5.6)

## 2021-09-21 LAB — CMP14+EGFR
ALT: 21 IU/L (ref 0–32)
AST: 22 IU/L (ref 0–40)
Albumin/Globulin Ratio: 1.2 (ref 1.2–2.2)
Albumin: 4.2 g/dL (ref 3.9–5.0)
Alkaline Phosphatase: 107 IU/L (ref 44–121)
BUN/Creatinine Ratio: 12 (ref 9–23)
BUN: 9 mg/dL (ref 6–20)
Bilirubin Total: 0.2 mg/dL (ref 0.0–1.2)
CO2: 24 mmol/L (ref 20–29)
Calcium: 9.2 mg/dL (ref 8.7–10.2)
Chloride: 102 mmol/L (ref 96–106)
Creatinine, Ser: 0.74 mg/dL (ref 0.57–1.00)
Globulin, Total: 3.5 g/dL (ref 1.5–4.5)
Glucose: 116 mg/dL — ABNORMAL HIGH (ref 70–99)
Potassium: 4.3 mmol/L (ref 3.5–5.2)
Sodium: 139 mmol/L (ref 134–144)
Total Protein: 7.7 g/dL (ref 6.0–8.5)
eGFR: 118 mL/min/{1.73_m2} (ref >59)

## 2021-09-21 LAB — TSH+FREE T4
Free T4: 1.51 ng/dL (ref 0.82–1.77)
TSH: 1.52 u[IU]/mL (ref 0.450–4.500)

## 2021-10-04 ENCOUNTER — Encounter: Payer: Self-pay | Admitting: Nurse Practitioner

## 2021-10-04 ENCOUNTER — Ambulatory Visit (INDEPENDENT_AMBULATORY_CARE_PROVIDER_SITE_OTHER): Payer: BC Managed Care – PPO | Admitting: Nurse Practitioner

## 2021-10-04 ENCOUNTER — Other Ambulatory Visit: Payer: Self-pay

## 2021-10-04 VITALS — BP 130/70 | HR 88 | Temp 98.2°F | Ht 69.0 in | Wt 334.2 lb

## 2021-10-04 DIAGNOSIS — Z0000 Encounter for general adult medical examination without abnormal findings: Secondary | ICD-10-CM

## 2021-10-04 DIAGNOSIS — K219 Gastro-esophageal reflux disease without esophagitis: Secondary | ICD-10-CM

## 2021-10-04 NOTE — Progress Notes (Signed)
? ?  Subjective:  ? ? Patient ID: Krista Guzman, female    DOB: 01-08-2001, 21 y.o.   MRN: [id number] ? ?HPI ? ?The patient comes in today for a wellness visit. ? ? ? ?A review of their health history was completed. ? A review of medications was also completed. ? ?Any needed refills; No ? ?Eating habits: Tries to eat good ? ?Falls/  MVA accidents in past few months: No ? ?Regular exercise: line worker ? ?Specialist pt sees on regular basis: No ? ?Preventative health issues were discussed.  ?Patient declines Pap smear today. ?Patient declines tetanus shot today. ?Patient declines HPV vaccines today. ?Patient declines HIV and hep C screening. ?Patient denies ever being sexually active.  Declines STD testing. ? ?Additional concerns: Patient states that she will occasionally have acid reflux after she eats greasy foods. ? ? ? ?Review of Systems  ?All other systems reviewed and are negative. ? ?   ?Objective:  ? Physical Exam ? ?1. Wellness exam ?Adult wellness-complete.wellness physical was conducted today. Importance of diet and exercise were discussed in detail.  ?In addition to this a discussion regarding safety was also covered. We also reviewed over immunizations and gave recommendations regarding current immunization needed for age.  ?In addition to this additional areas were also touched on including: ?Preventative health exams needed: ? ?Colonoscopy not indicated ?Patient declines Pap smear today. Never sexually active. ?Patient declines tetanus shot today. Will speak with mother ?Patient declines HPV vaccines today. Will speak with mother ?Patient declines HIV and hep C screening. Never sexually active. ?Patient denies ever being sexually active.  Declines STD testing. ? ?Patient was advised yearly wellness exam  ?-RTC in year for annual exam or sooner if needed.  ? ?2. Gastroesophageal reflux disease, unspecified whether esophagitis present ?- avoid trigger foods ?- remain up right at least 30 minutes after  eating ?- If you know you are going to eat a 'trigger" food, take a Protonix or tums prior to eating. ?-Eating well balanced meals with lean meats and exercise. ?- Mild weight loss may also be beneficial in GERD management.  ?-RTC in year for annual exam or sooner if needed.  ? ? ? ?   ?

## 2022-04-09 DIAGNOSIS — M79675 Pain in left toe(s): Secondary | ICD-10-CM | POA: Diagnosis not present

## 2022-04-09 DIAGNOSIS — L600 Ingrowing nail: Secondary | ICD-10-CM | POA: Diagnosis not present

## 2022-05-14 DIAGNOSIS — M79675 Pain in left toe(s): Secondary | ICD-10-CM | POA: Diagnosis not present

## 2022-05-14 DIAGNOSIS — L600 Ingrowing nail: Secondary | ICD-10-CM | POA: Diagnosis not present

## 2023-02-27 NOTE — Progress Notes (Signed)
This encounter was created in error - please disregard.

## 2023-08-07 ENCOUNTER — Ambulatory Visit: Payer: Managed Care, Other (non HMO) | Admitting: Family Medicine

## 2023-08-12 ENCOUNTER — Encounter: Payer: Self-pay | Admitting: Physician Assistant

## 2023-08-12 ENCOUNTER — Ambulatory Visit: Payer: BC Managed Care – PPO | Admitting: Physician Assistant

## 2023-08-12 VITALS — BP 130/85 | HR 89 | Temp 98.2°F | Ht 69.0 in | Wt 324.0 lb

## 2023-08-12 DIAGNOSIS — G5621 Lesion of ulnar nerve, right upper limb: Secondary | ICD-10-CM | POA: Diagnosis not present

## 2023-08-12 NOTE — Patient Instructions (Signed)
Ibuprofen as needed for pain  Follow up for physical around Mar/Apr Trial elbow stretches/exercises for symptoms relief

## 2023-08-12 NOTE — Progress Notes (Signed)
   Acute Office Visit  Subjective:     Patient ID: Krista Guzman, female    DOB: 2000-10-12, 23 y.o.   MRN: [id number]   HPI Patient is in today for right arm pain. Patient states she first noticed arm pain around April or May of 2024. She states pain is located in the medial aspect of her right bicep. She reports symptoms are very intermittent, bothering her about 2 times a month. She states occasional radiation to her pinky and ring finger. Patient is unable to attribute pain to specific movements but relates pain can be present even with small movements. She denies pain today.   Review of Systems  Constitutional:  Negative for chills, fever and weight loss.  Respiratory:  Negative for cough and wheezing.   Cardiovascular:  Negative for chest pain and palpitations.  Musculoskeletal:  Positive for joint pain.        Objective:     BP 130/85   Pulse 89   Temp 98.2 F (36.8 C)   Ht 5\' 9"  (1.753 m)   Wt (!) 324 lb (147 kg)   SpO2 98%   BMI 47.85 kg/m   Physical Exam Constitutional:      Appearance: Normal appearance. She is obese.  HENT:     Head: Normocephalic.     Mouth/Throat:     Mouth: Mucous membranes are moist.     Pharynx: Oropharynx is clear.  Eyes:     Extraocular Movements: Extraocular movements intact.     Conjunctiva/sclera: Conjunctivae normal.  Cardiovascular:     Rate and Rhythm: Normal rate and regular rhythm.     Heart sounds: No murmur heard.    No gallop.  Pulmonary:     Effort: Pulmonary effort is normal.     Breath sounds: No wheezing, rhonchi or rales.  Musculoskeletal:     Right upper arm: No tenderness.     Left upper arm: No tenderness.     Right elbow: No swelling. Normal range of motion. No tenderness.     Right lower leg: No edema.     Left lower leg: No edema.  Skin:    General: Skin is warm and dry.  Neurological:     General: No focal deficit present.     Mental Status: She is alert and oriented to person, place, and time.      Sensory: Sensation is intact.     Motor: No weakness.  Psychiatric:        Mood and Affect: Mood normal.        Behavior: Behavior normal.     No results found for any visits on 08/12/23.      Assessment & Plan:  Cubital tunnel syndrome on right   Patient stable at this time. Physical exam without abnormal findings. No weakness or sensory deficits. ROM within normal limits. Symptoms not aggravated with AROM or PROM. Symptoms likely related to ulnar nerve compression, however could be related to medial epicondylitis. Patient advised ibuprofen as needed for pain. Provided stretches for medial epicondylitis for potential symptom relief. We discussed activity modification and ergonomics for potential symptom relief as well. Will follow up on symptoms at patient's yearly physical in the spring.   Return in about 2 months (around 10/10/2023) for physical .  Toni Amend Tyjah Hai, PA-C

## 2023-10-09 ENCOUNTER — Ambulatory Visit: Payer: BC Managed Care – PPO | Admitting: Physician Assistant

## 2023-10-09 ENCOUNTER — Encounter: Payer: Self-pay | Admitting: Physician Assistant

## 2023-10-09 VITALS — BP 129/75 | HR 76 | Temp 98.8°F | Ht 69.0 in | Wt 313.0 lb

## 2023-10-09 DIAGNOSIS — Z0001 Encounter for general adult medical examination with abnormal findings: Secondary | ICD-10-CM | POA: Diagnosis not present

## 2023-10-09 DIAGNOSIS — R7303 Prediabetes: Secondary | ICD-10-CM

## 2023-10-09 DIAGNOSIS — Z13220 Encounter for screening for lipoid disorders: Secondary | ICD-10-CM | POA: Diagnosis not present

## 2023-10-09 DIAGNOSIS — Z0000 Encounter for general adult medical examination without abnormal findings: Secondary | ICD-10-CM

## 2023-10-09 DIAGNOSIS — Z1329 Encounter for screening for other suspected endocrine disorder: Secondary | ICD-10-CM | POA: Diagnosis not present

## 2023-10-09 NOTE — Progress Notes (Signed)
 Complete physical exam  Patient: Krista Guzman   DOB: 2000/09/28   22 y.o. Female  MRN: [id number]  Subjective:    Chief Complaint  Patient presents with   Annual Exam    No pap    Krista Guzman is a 23 y.o. female who presents today for a complete physical exam. She reports consuming a general and recently started eating in a calorie  diet. Is not a huge vegetable eater, will eat some fruits, working on increasing water intake.  Walks 8,000 steps a day for exercise.  She generally feels well. She reports sleeping well, reports inconsistent sleep schedule. She does not have additional problems to discuss today.    Most recent fall risk assessment:    08/12/2023    3:15 PM  Fall Risk   Falls in the past year? 1  Number falls in past yr: 0  Injury with Fall? 0     Most recent depression screenings:    10/09/2023   10:04 AM 08/12/2023    3:15 PM  PHQ 2/9 Scores  PHQ - 2 Score 0 1  PHQ- 9 Score 3 3    Vision:Within last year and Dental: No current dental problems and Receives regular dental care  Patient Care Team: Sonnie Bias, Toni Amend, [place] as PCP - General (Physician Assistant)   No outpatient medications prior to visit.   No facility-administered medications prior to visit.    Review of Systems  Constitutional:  Negative for fever, malaise/fatigue and weight loss.  Respiratory:  Negative for shortness of breath.   Cardiovascular:  Negative for chest pain.  Gastrointestinal:  Negative for constipation, diarrhea, nausea and vomiting.  Skin:  Negative for rash.  Neurological:  Negative for headaches.  Psychiatric/Behavioral:  Negative for depression. The patient is not nervous/anxious.           Objective:     BP 129/75   Pulse 76   Temp 98.8 F (37.1 C)   Ht 5\' 9"  (1.753 m)   Wt (!) 313 lb (142 kg)   LMP 08/07/2023 (Approximate)   SpO2 98%   BMI 46.22 kg/m  BP Readings from Last 3 Encounters:  10/09/23 129/75  08/12/23 130/85  10/04/21 130/70   Wt  Readings from Last 3 Encounters:  10/09/23 (!) 313 lb (142 kg)  08/12/23 (!) 324 lb (147 kg)  10/04/21 (!) 334 lb 3.2 oz (151.6 kg)      Physical Exam Constitutional:      Appearance: Normal appearance. She is obese.  HENT:     Head: Normocephalic.     Mouth/Throat:     Mouth: Mucous membranes are moist.     Pharynx: Oropharynx is clear.  Eyes:     Extraocular Movements: Extraocular movements intact.     Conjunctiva/sclera: Conjunctivae normal.  Cardiovascular:     Rate and Rhythm: Normal rate and regular rhythm.     Heart sounds: Normal heart sounds. No murmur heard. Pulmonary:     Effort: Pulmonary effort is normal.     Breath sounds: Normal breath sounds.  Musculoskeletal:     Cervical back: Normal range of motion and neck supple. No tenderness.     Right lower leg: No edema.     Left lower leg: No edema.  Lymphadenopathy:     Cervical: No cervical adenopathy.  Skin:    General: Skin is warm and dry.  Neurological:     General: No focal deficit present.     Mental Status: She  is alert and oriented to person, place, and time.  Psychiatric:        Mood and Affect: Mood normal.        Behavior: Behavior normal.      No results found for any visits on 10/09/23. Last metabolic panel Lab Results  Component Value Date   GLUCOSE 116 (H) 09/20/2021   NA 139 09/20/2021   K 4.3 09/20/2021   CL 102 09/20/2021   CO2 24 09/20/2021   BUN 9 09/20/2021   CREATININE 0.74 09/20/2021   EGFR 118 09/20/2021   CALCIUM 9.2 09/20/2021   PROT 7.7 09/20/2021   ALBUMIN 4.2 09/20/2021   LABGLOB 3.5 09/20/2021   AGRATIO 1.2 09/20/2021   BILITOT 0.2 09/20/2021   ALKPHOS 107 09/20/2021   AST 22 09/20/2021   ALT 21 09/20/2021   Last hemoglobin A1c Lab Results  Component Value Date   HGBA1C 5.8 (H) 09/20/2021      Assessment & Plan:    Routine Health Maintenance and Physical Exam  Health Maintenance  Topic Date Due   HPV Vaccine (1 - 3-dose series) Never done   HIV  Screening  Never done   Hepatitis C Screening  Never done   Pap Smear  Never done   DTaP/Tdap/Td vaccine (7 - Td or Tdap) 04/05/2021   Flu Shot  10/21/2023*   Pneumococcal Vaccination  Aged Out   COVID-19 Vaccine  Discontinued  *Topic was postponed. The date shown is not the original due date.    Discussed health benefits of physical activity, and encouraged her to engage in regular exercise appropriate for her age and condition.  Problem List Items Addressed This Visit       Other   Prediabetes   Relevant Orders   CMP14+EGFR   Hemoglobin A1c   Morbid obesity (HCC)   Relevant Orders   CMP14+EGFR   CBC with Differential   Other Visit Diagnoses       Annual visit for general adult medical examination without abnormal findings    -  Primary     Screening for thyroid disorder       Relevant Orders   TSH + free T4     Screening for lipoid disorders       Relevant Orders   Lipid panel      Adult wellness-complete.wellness physical was conducted today. Importance of diet and exercise were discussed in detail.  Importance of stress reduction and healthy living were discussed.  In addition to this a discussion regarding safety was also covered.  We also reviewed over immunizations and gave recommendations regarding current immunization needed for age.   In addition to this additional areas were also touched on including: prediabetes, repeating A1c.   Preventative health exams needed: Colonoscopy not indicated Patient declines Pap smear today. Never sexually active. Patient declines HIV and hep C screening. Never sexually active. Patient denies ever being sexually active.  Declines STD testing. Advised tetanus shot, overdue since 2022.   Patient was advised yearly wellness exam   Return in about 1 year (around 10/08/2024) for physical .    [name], PA-C

## 2023-10-10 ENCOUNTER — Encounter: Payer: Self-pay | Admitting: Physician Assistant

## 2023-10-10 LAB — CMP14+EGFR
ALT: 17 IU/L (ref 0–32)
AST: 16 IU/L (ref 0–40)
Albumin: 4.1 g/dL (ref 4.0–5.0)
Alkaline Phosphatase: 107 IU/L (ref 44–121)
BUN/Creatinine Ratio: 16 (ref 9–23)
BUN: 11 mg/dL (ref 6–20)
Bilirubin Total: 0.2 mg/dL (ref 0.0–1.2)
CO2: 22 mmol/L (ref 20–29)
Calcium: 9.2 mg/dL (ref 8.7–10.2)
Chloride: 102 mmol/L (ref 96–106)
Creatinine, Ser: 0.67 mg/dL (ref 0.57–1.00)
Globulin, Total: 3.1 g/dL (ref 1.5–4.5)
Glucose: 115 mg/dL — ABNORMAL HIGH (ref 70–99)
Potassium: 4.6 mmol/L (ref 3.5–5.2)
Sodium: 137 mmol/L (ref 134–144)
Total Protein: 7.2 g/dL (ref 6.0–8.5)
eGFR: 126 mL/min/{1.73_m2} (ref >59)

## 2023-10-10 LAB — CBC WITH DIFFERENTIAL/PLATELET
Basophils Absolute: 0.1 10*3/uL (ref 0.0–0.2)
Basos: 1 %
EOS (ABSOLUTE): 0.1 10*3/uL (ref 0.0–0.4)
Eos: 1 %
Hematocrit: 39.5 % (ref 34.0–46.6)
Hemoglobin: 12.5 g/dL (ref 11.1–15.9)
Immature Grans (Abs): 0 10*3/uL (ref 0.0–0.1)
Immature Granulocytes: 0 %
Lymphocytes Absolute: 2.5 10*3/uL (ref 0.7–3.1)
Lymphs: 24 %
MCH: 25.1 pg — ABNORMAL LOW (ref 26.6–33.0)
MCHC: 31.6 g/dL (ref 31.5–35.7)
MCV: 79 fL (ref 79–97)
Monocytes Absolute: 0.4 10*3/uL (ref 0.1–0.9)
Monocytes: 4 %
Neutrophils Absolute: 7.1 10*3/uL — ABNORMAL HIGH (ref 1.4–7.0)
Neutrophils: 70 %
Platelets: 331 10*3/uL (ref 150–450)
RBC: 4.98 x10E6/uL (ref 3.77–5.28)
RDW: 14.5 % (ref 11.7–15.4)
WBC: 10.3 10*3/uL (ref 3.4–10.8)

## 2023-10-10 LAB — LIPID PANEL
Chol/HDL Ratio: 3.3 ratio (ref 0.0–4.4)
Cholesterol, Total: 127 mg/dL (ref 100–199)
HDL: 38 mg/dL — ABNORMAL LOW (ref >39)
LDL Chol Calc (NIH): 78 mg/dL (ref 0–99)
Triglycerides: 46 mg/dL (ref 0–149)
VLDL Cholesterol Cal: 11 mg/dL (ref 5–40)

## 2023-10-10 LAB — TSH+FREE T4
Free T4: 1.26 ng/dL (ref 0.82–1.77)
TSH: 1.56 u[IU]/mL (ref 0.450–4.500)

## 2023-10-10 LAB — HEMOGLOBIN A1C
Est. average glucose Bld gHb Est-mCnc: 111 mg/dL
Hgb A1c MFr Bld: 5.5 % (ref 4.8–5.6)

## 2023-10-21 DIAGNOSIS — F439 Reaction to severe stress, unspecified: Secondary | ICD-10-CM | POA: Diagnosis not present

## 2023-10-28 DIAGNOSIS — F439 Reaction to severe stress, unspecified: Secondary | ICD-10-CM | POA: Diagnosis not present
